# Patient Record
Sex: Male | Born: 1960
Health system: Southern US, Community
[De-identification: ages and names within clinical notes are randomized; demographics above are authoritative.]

## PROBLEM LIST (undated history)

## (undated) DIAGNOSIS — M1990 Unspecified osteoarthritis, unspecified site: Secondary | ICD-10-CM

## (undated) DIAGNOSIS — J189 Pneumonia, unspecified organism: Secondary | ICD-10-CM

## (undated) DIAGNOSIS — K219 Gastro-esophageal reflux disease without esophagitis: Secondary | ICD-10-CM

## (undated) HISTORY — PX: JOINT REPLACEMENT: SHX530

## (undated) HISTORY — PX: KNEE ARTHROSCOPY: SUR90

---
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
## 1999-04-09 HISTORY — PX: CERVICAL DISC SURGERY: SHX588

## 2003-11-25 ENCOUNTER — Encounter: Admission: RE | Admit: 2003-11-25 | Discharge: 2003-11-25 | Payer: Self-pay | Admitting: Orthopaedic Surgery

## 2006-06-02 ENCOUNTER — Ambulatory Visit (HOSPITAL_BASED_OUTPATIENT_CLINIC_OR_DEPARTMENT_OTHER): Admission: RE | Admit: 2006-06-02 | Discharge: 2006-06-02 | Payer: Self-pay | Admitting: Orthopedic Surgery

## 2010-04-08 HISTORY — PX: SHOULDER ARTHROSCOPY: SHX128

## 2015-01-30 ENCOUNTER — Other Ambulatory Visit: Payer: Self-pay | Admitting: Orthopedic Surgery

## 2015-02-03 ENCOUNTER — Other Ambulatory Visit: Payer: Self-pay | Admitting: Orthopedic Surgery

## 2015-02-03 DIAGNOSIS — M19012 Primary osteoarthritis, left shoulder: Secondary | ICD-10-CM

## 2015-02-10 ENCOUNTER — Ambulatory Visit
Admission: RE | Admit: 2015-02-10 | Discharge: 2015-02-10 | Disposition: A | Discharge status: Home / Self Care | Payer: Self-pay | Source: Ambulatory Visit | Attending: Orthopedic Surgery | Admitting: Orthopedic Surgery

## 2015-02-10 DIAGNOSIS — M19012 Primary osteoarthritis, left shoulder: Secondary | ICD-10-CM

## 2015-02-14 NOTE — Pre-Procedure Instructions (Addendum)
Jared Brooks  02/14/2015     No Pharmacies Listed   Your procedure is scheduled on Thursday, February 23, 2015  [facility] Admitting at 5:30 A.M.  Call this number if you have problems the morning of surgery:  [phone]   Remember:  Do not eat food or drink liquids after midnight Wednesday, February 22, 2015  Take these medicines the morning of surgery with A SIP OF WATER:  omeprazole (PRILOSEC OTC)  Stop taking Aspirin, vitamins, fish oil and herbal medications. Do not take any NSAIDs ie: Ibuprofen, Advil, Naproxen or any medication containing Aspirin; stop Thursday, February 16, 2015.   Do not wear jewelry, make-up or nail polish.  Do not wear lotions, powders, or perfumes.  You may not wear deodorant.  Do not shave 48 hours prior to surgery.  Men may shave face and neck.  Do not bring valuables to the hospital.  [facility] not [facility] any belongings or valuables.  Contacts, dentures or bridgework may not be worn into surgery.  Leave your suitcase in the car.  After surgery it may be brought to your room.  For patients admitted to the hospital, discharge time will be determined by your treatment team.  Patients discharged the day of surgery will not be allowed to drive home.   Name and phone number of your driver:    Special instructions:  Special Instructions:Special Instructions: [facility]  Before surgery, you can play an important role.  Because skin is not sterile, your skin needs to be as free of germs as possible.  You can reduce the number of germs on you skin by washing with CHG (chlorahexidine gluconate) soap before surgery.  CHG is an antiseptic cleaner which kills germs and bonds with the skin to continue killing germs even after washing.  Please DO NOT use if you have an allergy to CHG or antibacterial soaps.  If your skin becomes reddened/irritated stop using the CHG and inform your nurse when you  arrive at Short Stay.  Do not shave (including legs and underarms) for at least 48 hours prior to the first CHG shower.  You may shave your face.  Please follow these instructions carefully:   1.  Shower with CHG Soap the night before surgery and the morning of Surgery.  2.  If you choose to wash your hair, wash your hair first as usual with your normal shampoo.  3.  After you shampoo, rinse your hair and body thoroughly to remove the Shampoo.  4.  Use CHG as you would any other liquid soap.  You can apply chg directly  to the skin and wash gently with scrungie or a clean washcloth.  5.  Apply the CHG Soap to your body ONLY FROM THE NECK DOWN.  Do not use on open wounds or open sores.  Avoid contact with your eyes, ears, mouth and genitals (private parts).  Wash genitals (private parts) with your normal soap.  6.  Wash thoroughly, paying special attention to the area where your surgery will be performed.  7.  Thoroughly rinse your body with warm water from the neck down.  8.  DO NOT shower/wash with your normal soap after using and rinsing off the CHG Soap.  9.  Pat yourself dry with a clean towel.            10.  Wear clean pajamas.  11.  Place clean sheets on your bed the night of your first shower and do not sleep with pets.  Day of Surgery  Do not apply any lotions/deodorants the morning of surgery.  Please wear clean clothes to the hospital/surgery center.  Please read over the following fact sheets that you were given. Pain Booklet, Coughing and Deep Breathing, MRSA Information and Surgical Site Infection Prevention

## 2015-02-15 ENCOUNTER — Encounter (HOSPITAL_COMMUNITY)
Admission: RE | Admit: 2015-02-15 | Discharge: 2015-02-15 | Disposition: A | Discharge status: Home / Self Care | Payer: 59 | Source: Ambulatory Visit | Attending: Orthopedic Surgery | Admitting: Orthopedic Surgery

## 2015-02-15 ENCOUNTER — Encounter (HOSPITAL_COMMUNITY): Payer: Self-pay

## 2015-02-15 DIAGNOSIS — Z01818 Encounter for other preprocedural examination: Secondary | ICD-10-CM | POA: Diagnosis not present

## 2015-02-15 DIAGNOSIS — M19012 Primary osteoarthritis, left shoulder: Secondary | ICD-10-CM | POA: Diagnosis not present

## 2015-02-15 HISTORY — DX: Pneumonia, unspecified organism: J18.9

## 2015-02-15 HISTORY — DX: Unspecified osteoarthritis, unspecified site: M19.90

## 2015-02-15 HISTORY — DX: Gastro-esophageal reflux disease without esophagitis: K21.9

## 2015-02-15 LAB — URINALYSIS, ROUTINE W REFLEX MICROSCOPIC
BILIRUBIN URINE: NEGATIVE
Glucose, UA: NEGATIVE mg/dL
HGB URINE DIPSTICK: NEGATIVE
KETONES UR: NEGATIVE mg/dL
Leukocytes, UA: NEGATIVE
NITRITE: NEGATIVE
PROTEIN: NEGATIVE mg/dL
SPECIFIC GRAVITY, URINE: 1.02 (ref 1.005–1.030)
UROBILINOGEN UA: 0.2 mg/dL (ref 0.0–1.0)
pH: 5.5 (ref 5.0–8.0)

## 2015-02-15 LAB — APTT: aPTT: 28 seconds (ref 24–37)

## 2015-02-15 LAB — CBC WITH DIFFERENTIAL/PLATELET
BASOS PCT: 1 %
Basophils Absolute: 0 10*3/uL (ref 0.0–0.1)
EOS ABS: 0.1 10*3/uL (ref 0.0–0.7)
Eosinophils Relative: 3 %
HEMATOCRIT: 44.9 % (ref 39.0–52.0)
HEMOGLOBIN: 15.4 g/dL (ref 13.0–17.0)
LYMPHS ABS: 0.9 10*3/uL (ref 0.7–4.0)
Lymphocytes Relative: 23 %
MCH: 31.8 pg (ref 26.0–34.0)
MCHC: 34.3 g/dL (ref 30.0–36.0)
MCV: 92.6 fL (ref 78.0–100.0)
MONOS PCT: 11 %
Monocytes Absolute: 0.4 10*3/uL (ref 0.1–1.0)
NEUTROS ABS: 2.4 10*3/uL (ref 1.7–7.7)
NEUTROS PCT: 62 %
Platelets: 222 10*3/uL (ref 150–400)
RBC: 4.85 MIL/uL (ref 4.22–5.81)
RDW: 12.4 % (ref 11.5–15.5)
WBC: 3.8 10*3/uL — AB (ref 4.0–10.5)

## 2015-02-15 LAB — COMPREHENSIVE METABOLIC PANEL
ALT: 18 U/L (ref 17–63)
ANION GAP: 7 (ref 5–15)
AST: 20 U/L (ref 15–41)
Albumin: 3.9 g/dL (ref 3.5–5.0)
Alkaline Phosphatase: 77 U/L (ref 38–126)
BILIRUBIN TOTAL: 0.4 mg/dL (ref 0.3–1.2)
BUN: 13 mg/dL (ref 6–20)
CHLORIDE: 104 mmol/L (ref 101–111)
CO2: 29 mmol/L (ref 22–32)
Calcium: 9.6 mg/dL (ref 8.9–10.3)
Creatinine, Ser: 1.2 mg/dL (ref 0.61–1.24)
Glucose, Bld: 91 mg/dL (ref 65–99)
POTASSIUM: 4.5 mmol/L (ref 3.5–5.1)
SODIUM: 140 mmol/L (ref 135–145)
TOTAL PROTEIN: 6.7 g/dL (ref 6.5–8.1)

## 2015-02-15 LAB — PROTIME-INR
INR: 1.04 (ref 0.00–1.49)
PROTHROMBIN TIME: 13.8 s (ref 11.6–15.2)

## 2015-02-15 LAB — SURGICAL PCR SCREEN
MRSA, PCR: NEGATIVE
STAPHYLOCOCCUS AUREUS: NEGATIVE

## 2015-02-22 MED ORDER — CEFAZOLIN SODIUM-DEXTROSE 2-3 GM-% IV SOLR
2.0000 g | INTRAVENOUS | Status: AC
  Administered 2015-02-23: 2 g via INTRAVENOUS
  Filled 2015-02-22: qty 50

## 2015-02-22 MED ORDER — POVIDONE-IODINE 7.5 % EX SOLN
Freq: Once | CUTANEOUS | Status: DC
  Filled 2015-02-22: qty 118

## 2015-02-23 ENCOUNTER — Inpatient Hospital Stay (HOSPITAL_COMMUNITY)
Admission: RE | Admit: 2015-02-23 | Discharge: 2015-02-24 | DRG: 483 | Disposition: A | Discharge status: Home / Self Care | Payer: 59 | Source: Ambulatory Visit | Attending: Orthopedic Surgery | Admitting: Orthopedic Surgery

## 2015-02-23 ENCOUNTER — Inpatient Hospital Stay (HOSPITAL_COMMUNITY): Payer: 59

## 2015-02-23 ENCOUNTER — Inpatient Hospital Stay (HOSPITAL_COMMUNITY): Payer: 59 | Admitting: Anesthesiology

## 2015-02-23 ENCOUNTER — Encounter (HOSPITAL_COMMUNITY)
Admission: RE | Disposition: A | Discharge status: Home / Self Care | Payer: Self-pay | Source: Ambulatory Visit | Attending: Orthopedic Surgery

## 2015-02-23 ENCOUNTER — Encounter (HOSPITAL_COMMUNITY): Payer: Self-pay | Admitting: *Deleted

## 2015-02-23 DIAGNOSIS — M19019 Primary osteoarthritis, unspecified shoulder: Secondary | ICD-10-CM | POA: Diagnosis present

## 2015-02-23 DIAGNOSIS — F17220 Nicotine dependence, chewing tobacco, uncomplicated: Secondary | ICD-10-CM | POA: Diagnosis present

## 2015-02-23 DIAGNOSIS — M19012 Primary osteoarthritis, left shoulder: Principal | ICD-10-CM | POA: Diagnosis present

## 2015-02-23 DIAGNOSIS — K219 Gastro-esophageal reflux disease without esophagitis: Secondary | ICD-10-CM | POA: Diagnosis present

## 2015-02-23 DIAGNOSIS — Z96612 Presence of left artificial shoulder joint: Secondary | ICD-10-CM

## 2015-02-23 HISTORY — PX: TOTAL SHOULDER ARTHROPLASTY: SHX126

## 2015-02-23 SURGERY — ARTHROPLASTY, SHOULDER, TOTAL
Anesthesia: Regional | Site: Shoulder | Laterality: Left

## 2015-02-23 MED ORDER — DIPHENHYDRAMINE HCL 12.5 MG/5ML PO ELIX: 12.5000 mg | ORAL_SOLUTION | ORAL | Status: DC | PRN

## 2015-02-23 MED ORDER — EPHEDRINE SULFATE 50 MG/ML IJ SOLN
INTRAMUSCULAR | Status: AC
  Filled 2015-02-23: qty 1

## 2015-02-23 MED ORDER — METOCLOPRAMIDE HCL 5 MG/ML IJ SOLN: 5.0000 mg | Freq: Three times a day (TID) | INTRAMUSCULAR | Status: DC | PRN

## 2015-02-23 MED ORDER — BUPIVACAINE-EPINEPHRINE (PF) 0.5% -1:200000 IJ SOLN
INTRAMUSCULAR | Status: DC | PRN
  Administered 2015-02-23: 30 mL via PERINEURAL

## 2015-02-23 MED ORDER — ONDANSETRON HCL 4 MG PO TABS: 4.0000 mg | ORAL_TABLET | Freq: Four times a day (QID) | ORAL | Status: DC | PRN

## 2015-02-23 MED ORDER — OXYCODONE HCL 5 MG PO TABS
5.0000 mg | ORAL_TABLET | ORAL | Status: DC | PRN
  Administered 2015-02-23 – 2015-02-24 (×5): 10 mg via ORAL
  Filled 2015-02-23 (×5): qty 2

## 2015-02-23 MED ORDER — PROPOFOL 10 MG/ML IV BOLUS
INTRAVENOUS | Status: AC
  Filled 2015-02-23: qty 40

## 2015-02-23 MED ORDER — ACETAMINOPHEN 500 MG PO TABS
1000.0000 mg | ORAL_TABLET | Freq: Four times a day (QID) | ORAL | Status: AC
  Administered 2015-02-23 – 2015-02-24 (×4): 1000 mg via ORAL
  Filled 2015-02-23 (×4): qty 2

## 2015-02-23 MED ORDER — GLYCOPYRROLATE 0.2 MG/ML IJ SOLN
INTRAMUSCULAR | Status: AC
  Filled 2015-02-23: qty 1

## 2015-02-23 MED ORDER — CEFAZOLIN SODIUM-DEXTROSE 2-3 GM-% IV SOLR
2.0000 g | Freq: Four times a day (QID) | INTRAVENOUS | Status: AC
  Administered 2015-02-23 – 2015-02-24 (×2): 2 g via INTRAVENOUS
  Filled 2015-02-23 (×4): qty 50

## 2015-02-23 MED ORDER — SODIUM CHLORIDE 0.9 % IR SOLN
Status: DC | PRN
  Administered 2015-02-23: 1
  Administered 2015-02-23: 3000 mL

## 2015-02-23 MED ORDER — ROCURONIUM BROMIDE 50 MG/5ML IV SOLN
INTRAVENOUS | Status: AC
  Filled 2015-02-23: qty 1

## 2015-02-23 MED ORDER — MIDAZOLAM HCL 2 MG/2ML IJ SOLN
INTRAMUSCULAR | Status: AC
  Filled 2015-02-23: qty 2

## 2015-02-23 MED ORDER — SUGAMMADEX SODIUM 200 MG/2ML IV SOLN
INTRAVENOUS | Status: AC
  Filled 2015-02-23: qty 2

## 2015-02-23 MED ORDER — DOCUSATE SODIUM 100 MG PO CAPS
100.0000 mg | ORAL_CAPSULE | Freq: Two times a day (BID) | ORAL | Status: DC
  Administered 2015-02-23 – 2015-02-24 (×3): 100 mg via ORAL
  Filled 2015-02-23 (×3): qty 1

## 2015-02-23 MED ORDER — FENTANYL CITRATE (PF) 100 MCG/2ML IJ SOLN
INTRAMUSCULAR | Status: DC | PRN
  Administered 2015-02-23 (×3): 50 ug via INTRAVENOUS

## 2015-02-23 MED ORDER — ALUM & MAG HYDROXIDE-SIMETH 200-200-20 MG/5ML PO SUSP: 30.0000 mL | ORAL | Status: DC | PRN

## 2015-02-23 MED ORDER — POLYETHYLENE GLYCOL 3350 17 G PO PACK: 17.0000 g | PACK | Freq: Every day | ORAL | Status: DC | PRN

## 2015-02-23 MED ORDER — SODIUM CHLORIDE 0.9 % IJ SOLN
INTRAMUSCULAR | Status: AC
  Filled 2015-02-23: qty 10

## 2015-02-23 MED ORDER — OMEPRAZOLE MAGNESIUM 20 MG PO TBEC: 20.0000 mg | DELAYED_RELEASE_TABLET | Freq: Every day | ORAL | Status: DC

## 2015-02-23 MED ORDER — ONDANSETRON HCL 4 MG/2ML IJ SOLN
INTRAMUSCULAR | Status: DC | PRN
Start: 2015-02-23 — End: 2015-02-23
  Administered 2015-02-23: 4 mg via INTRAVENOUS

## 2015-02-23 MED ORDER — HEMOSTATIC AGENTS (NO CHARGE) OPTIME
TOPICAL | Status: DC | PRN
  Administered 2015-02-23: 1 via TOPICAL

## 2015-02-23 MED ORDER — DOCUSATE SODIUM 100 MG PO CAPS: 100.0000 mg | ORAL_CAPSULE | Freq: Three times a day (TID) | ORAL | Status: AC | PRN

## 2015-02-23 MED ORDER — ZOLPIDEM TARTRATE 5 MG PO TABS: 5.0000 mg | ORAL_TABLET | Freq: Every evening | ORAL | Status: DC | PRN

## 2015-02-23 MED ORDER — ONDANSETRON HCL 4 MG/2ML IJ SOLN: 4.0000 mg | Freq: Four times a day (QID) | INTRAMUSCULAR | Status: DC | PRN

## 2015-02-23 MED ORDER — TRANEXAMIC ACID 1000 MG/10ML IV SOLN
1000.0000 mg | INTRAVENOUS | Status: AC
  Administered 2015-02-23: 1000 mg via INTRAVENOUS
  Filled 2015-02-23: qty 10

## 2015-02-23 MED ORDER — LIDOCAINE HCL (CARDIAC) 20 MG/ML IV SOLN
INTRAVENOUS | Status: AC
  Filled 2015-02-23: qty 5

## 2015-02-23 MED ORDER — ASPIRIN EC 325 MG PO TBEC
325.0000 mg | DELAYED_RELEASE_TABLET | Freq: Two times a day (BID) | ORAL | Status: DC
  Administered 2015-02-23 – 2015-02-24 (×2): 325 mg via ORAL
  Filled 2015-02-23 (×2): qty 1

## 2015-02-23 MED ORDER — BISACODYL 5 MG PO TBEC: 5.0000 mg | DELAYED_RELEASE_TABLET | Freq: Every day | ORAL | Status: DC | PRN

## 2015-02-23 MED ORDER — LIDOCAINE HCL (CARDIAC) 20 MG/ML IV SOLN
INTRAVENOUS | Status: DC | PRN
  Administered 2015-02-23: 50 mg via INTRAVENOUS

## 2015-02-23 MED ORDER — PHENYLEPHRINE 40 MCG/ML (10ML) SYRINGE FOR IV PUSH (FOR BLOOD PRESSURE SUPPORT)
PREFILLED_SYRINGE | INTRAVENOUS | Status: AC
  Filled 2015-02-23: qty 10

## 2015-02-23 MED ORDER — PROPOFOL 10 MG/ML IV BOLUS
INTRAVENOUS | Status: DC | PRN
Start: 2015-02-23 — End: 2015-02-23
  Administered 2015-02-23: 200 mg via INTRAVENOUS

## 2015-02-23 MED ORDER — ROCURONIUM BROMIDE 50 MG/5ML IV SOLN
INTRAVENOUS | Status: AC
  Filled 2015-02-23: qty 2

## 2015-02-23 MED ORDER — FENTANYL CITRATE (PF) 250 MCG/5ML IJ SOLN
INTRAMUSCULAR | Status: AC
  Filled 2015-02-23: qty 5

## 2015-02-23 MED ORDER — METOCLOPRAMIDE HCL 5 MG PO TABS: 5.0000 mg | ORAL_TABLET | Freq: Three times a day (TID) | ORAL | Status: DC | PRN

## 2015-02-23 MED ORDER — SUCCINYLCHOLINE CHLORIDE 20 MG/ML IJ SOLN
INTRAMUSCULAR | Status: AC
  Filled 2015-02-23: qty 1

## 2015-02-23 MED ORDER — LACTATED RINGERS IV SOLN
INTRAVENOUS | Status: DC | PRN
  Administered 2015-02-23 (×2): via INTRAVENOUS

## 2015-02-23 MED ORDER — MIDAZOLAM HCL 5 MG/5ML IJ SOLN
INTRAMUSCULAR | Status: DC | PRN
  Administered 2015-02-23 (×2): 1 mg via INTRAVENOUS

## 2015-02-23 MED ORDER — FLEET ENEMA 7-19 GM/118ML RE ENEM: 1.0000 | ENEMA | Freq: Once | RECTAL | Status: DC | PRN

## 2015-02-23 MED ORDER — MENTHOL 3 MG MT LOZG
1.0000 | LOZENGE | OROMUCOSAL | Status: DC | PRN
Start: 2015-02-23 — End: 2015-02-24

## 2015-02-23 MED ORDER — SUGAMMADEX SODIUM 200 MG/2ML IV SOLN
INTRAVENOUS | Status: DC | PRN
  Administered 2015-02-23: 175.4 mg via INTRAVENOUS

## 2015-02-23 MED ORDER — HYDROMORPHONE HCL 1 MG/ML IJ SOLN: 0.2500 mg | INTRAMUSCULAR | Status: DC | PRN

## 2015-02-23 MED ORDER — SODIUM CHLORIDE 0.9 % IV SOLN
INTRAVENOUS | Status: DC
  Administered 2015-02-23 – 2015-02-24 (×2): via INTRAVENOUS

## 2015-02-23 MED ORDER — MORPHINE SULFATE (PF) 2 MG/ML IV SOLN: 2.0000 mg | INTRAVENOUS | Status: DC | PRN

## 2015-02-23 MED ORDER — ACETAMINOPHEN 325 MG PO TABS: 650.0000 mg | ORAL_TABLET | Freq: Four times a day (QID) | ORAL | Status: DC | PRN

## 2015-02-23 MED ORDER — DEXAMETHASONE SODIUM PHOSPHATE 4 MG/ML IJ SOLN
INTRAMUSCULAR | Status: DC | PRN
  Administered 2015-02-23: 8 mg via INTRAVENOUS

## 2015-02-23 MED ORDER — DEXAMETHASONE SODIUM PHOSPHATE 4 MG/ML IJ SOLN
INTRAMUSCULAR | Status: AC
  Filled 2015-02-23: qty 2

## 2015-02-23 MED ORDER — PHENOL 1.4 % MT LIQD: 1.0000 | OROMUCOSAL | Status: DC | PRN

## 2015-02-23 MED ORDER — ONDANSETRON HCL 4 MG/2ML IJ SOLN
INTRAMUSCULAR | Status: AC
  Filled 2015-02-23: qty 2

## 2015-02-23 MED ORDER — PHENYLEPHRINE HCL 10 MG/ML IJ SOLN
10.0000 mg | INTRAMUSCULAR | Status: DC | PRN
  Administered 2015-02-23: 20 ug/min via INTRAVENOUS

## 2015-02-23 MED ORDER — PANTOPRAZOLE SODIUM 40 MG PO TBEC
40.0000 mg | DELAYED_RELEASE_TABLET | Freq: Every day | ORAL | Status: DC
  Administered 2015-02-23 – 2015-02-24 (×2): 40 mg via ORAL
  Filled 2015-02-23 (×2): qty 1

## 2015-02-23 MED ORDER — OXYCODONE-ACETAMINOPHEN 5-325 MG PO TABS: 1.0000 | ORAL_TABLET | ORAL | Status: AC | PRN

## 2015-02-23 MED ORDER — ACETAMINOPHEN 650 MG RE SUPP: 650.0000 mg | Freq: Four times a day (QID) | RECTAL | Status: DC | PRN

## 2015-02-23 MED ORDER — LIDOCAINE HCL 4 % MT SOLN
OROMUCOSAL | Status: DC | PRN
  Administered 2015-02-23: 3 mL via TOPICAL

## 2015-02-23 MED ORDER — ROCURONIUM BROMIDE 100 MG/10ML IV SOLN
INTRAVENOUS | Status: DC | PRN
  Administered 2015-02-23: 20 mg via INTRAVENOUS
  Administered 2015-02-23: 30 mg via INTRAVENOUS
  Administered 2015-02-23 (×2): 20 mg via INTRAVENOUS

## 2015-02-23 SURGICAL SUPPLY — 69 items
BIT DRILL 5/64X5 DISP (BIT) ×1 IMPLANT
BLADE SAW SAG 73X25 THK (BLADE) ×1
BLADE SAW SGTL 73X25 THK (BLADE) ×1 IMPLANT
BLADE SURG 15 STRL LF DISP TIS (BLADE) ×1 IMPLANT
BLADE SURG 15 STRL SS (BLADE) ×2
BROCHE GUIDE ×2 IMPLANT
CAP SHOULDER TOTAL 2 ×1 IMPLANT
CEMENT BONE DEPUY (Cement) ×1 IMPLANT
CHLORAPREP W/TINT 26ML (MISCELLANEOUS) ×2 IMPLANT
COVER MAYO STAND STRL (DRAPES) ×2 IMPLANT
COVER SURGICAL LIGHT HANDLE (MISCELLANEOUS) ×2 IMPLANT
DRAPE IMP U-DRAPE 54X76 (DRAPES) ×2 IMPLANT
DRAPE INCISE IOBAN 66X45 STRL (DRAPES) ×3 IMPLANT
DRAPE ORTHO SPLIT 77X108 STRL (DRAPES) ×4
DRAPE SURG 17X23 STRL (DRAPES) ×2 IMPLANT
DRAPE SURG ORHT 6 SPLT 77X108 (DRAPES) ×2 IMPLANT
DRAPE U-SHAPE 47X51 STRL (DRAPES) ×2 IMPLANT
DRSG AQUACEL AG ADV 3.5X10 (GAUZE/BANDAGES/DRESSINGS) ×1 IMPLANT
ELECT BLADE 4.0 EZ CLEAN MEGAD (MISCELLANEOUS) ×2
ELECT REM PT RETURN 9FT ADLT (ELECTROSURGICAL) ×2
ELECTRODE BLDE 4.0 EZ CLN MEGD (MISCELLANEOUS) IMPLANT
ELECTRODE REM PT RTRN 9FT ADLT (ELECTROSURGICAL) ×1 IMPLANT
EVACUATOR 1/8 PVC DRAIN (DRAIN) IMPLANT
GLOVE BIO SURGEON STRL SZ7 (GLOVE) ×2 IMPLANT
GLOVE BIO SURGEON STRL SZ7.5 (GLOVE) ×2 IMPLANT
GLOVE BIOGEL PI IND STRL 7.0 (GLOVE) ×1 IMPLANT
GLOVE BIOGEL PI IND STRL 8 (GLOVE) ×1 IMPLANT
GLOVE BIOGEL PI INDICATOR 7.0 (GLOVE) ×1
GLOVE BIOGEL PI INDICATOR 8 (GLOVE) ×1
GOWN STRL REUS W/ TWL LRG LVL3 (GOWN DISPOSABLE) ×1 IMPLANT
GOWN STRL REUS W/ TWL XL LVL3 (GOWN DISPOSABLE) ×1 IMPLANT
GOWN STRL REUS W/TWL LRG LVL3 (GOWN DISPOSABLE) ×2
GOWN STRL REUS W/TWL XL LVL3 (GOWN DISPOSABLE) ×2
HANDPIECE INTERPULSE COAX TIP (DISPOSABLE) ×2
HEMOSTAT SURGICEL 2X14 (HEMOSTASIS) ×2 IMPLANT
HOOD PEEL AWAY FACE SHEILD DIS (HOOD) ×4 IMPLANT
KIT BASIN OR (CUSTOM PROCEDURE TRAY) ×2 IMPLANT
KIT ROOM TURNOVER OR (KITS) ×2 IMPLANT
MANIFOLD NEPTUNE II (INSTRUMENTS) ×2 IMPLANT
NDL HYPO 25GX1X1/2 BEV (NEEDLE) IMPLANT
NDL MAYO TROCAR (NEEDLE) ×1 IMPLANT
NEEDLE HYPO 25GX1X1/2 BEV (NEEDLE) ×2 IMPLANT
NEEDLE MAYO TROCAR (NEEDLE) ×6 IMPLANT
NS IRRIG 1000ML POUR BTL (IV SOLUTION) ×2 IMPLANT
PACK SHOULDER (CUSTOM PROCEDURE TRAY) ×2 IMPLANT
PAD ARMBOARD 7.5X6 YLW CONV (MISCELLANEOUS) ×4 IMPLANT
RETRIEVER SUT HEWSON (MISCELLANEOUS) ×2 IMPLANT
SET HNDPC FAN SPRY TIP SCT (DISPOSABLE) ×1 IMPLANT
SLING ARM IMMOBILIZER LRG (SOFTGOODS) ×2 IMPLANT
SLING ARM IMMOBILIZER MED (SOFTGOODS) IMPLANT
SMARTMIX MINI TOWER (MISCELLANEOUS) ×2
SPONGE LAP 18X18 X RAY DECT (DISPOSABLE) ×2 IMPLANT
SPONGE LAP 4X18 X RAY DECT (DISPOSABLE) IMPLANT
STRIP CLOSURE SKIN 1/2X4 (GAUZE/BANDAGES/DRESSINGS) ×2 IMPLANT
SUCTION FRAZIER TIP 10 FR DISP (SUCTIONS) ×2 IMPLANT
SUPPORT WRAP ARM LG (MISCELLANEOUS) ×2 IMPLANT
SUT ETHIBOND NAB CT1 #1 30IN (SUTURE) ×5 IMPLANT
SUT MNCRL AB 4-0 PS2 18 (SUTURE) ×2 IMPLANT
SUT SILK 2 0 TIES 17X18 (SUTURE)
SUT SILK 2-0 18XBRD TIE BLK (SUTURE) IMPLANT
SUT VIC AB 0 CTB1 27 (SUTURE) ×1 IMPLANT
SUT VIC AB 2-0 CT1 27 (SUTURE) ×2
SUT VIC AB 2-0 CT1 TAPERPNT 27 (SUTURE) ×1 IMPLANT
SYR CONTROL 10ML LL (SYRINGE) IMPLANT
TAPE FIBER 2MM 7IN #2 BLUE (SUTURE) ×3 IMPLANT
TAPE SUT LABRALTAP WHT/BLK (SUTURE) ×2 IMPLANT
TOWEL OR 17X24 6PK STRL BLUE (TOWEL DISPOSABLE) ×2 IMPLANT
TOWEL OR 17X26 10 PK STRL BLUE (TOWEL DISPOSABLE) ×2 IMPLANT
TOWER SMARTMIX MINI (MISCELLANEOUS) ×1 IMPLANT

## 2015-02-23 NOTE — Discharge Instructions (Signed)

## 2015-02-23 NOTE — Anesthesia Postprocedure Evaluation (Signed)
  Anesthesia Post-op Note  Patient: Jared Brooks  Procedure(s) Performed: Procedure(s) with comments: TOTAL SHOULDER ARTHROPLASTY (Left) - Left shoulder total arthroplasty  Patient Location: PACU  Anesthesia Type:General and block  Level of Consciousness: awake and alert   Airway and Oxygen Therapy: Patient Spontanous Breathing  Post-op Pain: Controlled  Post-op Assessment: Post-op Vital signs reviewed, Patient's Cardiovascular Status Stable and Respiratory Function Stable  Post-op Vital Signs: Reviewed  Filed Vitals:   02/23/15 1130  BP: 95/58  Pulse: 69  Temp: 36.9 C  Resp: 12    Complications: No apparent anesthesia complications

## 2015-02-23 NOTE — Care Management (Signed)
Utilization review completed. Loyal Holzheimer, RN Case Manager [phone]. 

## 2015-02-23 NOTE — Progress Notes (Signed)
Report given to robin rn as caregiver 

## 2015-02-23 NOTE — Transfer of Care (Signed)
Immediate Anesthesia Transfer of Care Note  Patient: Jared Brooks  Procedure(s) Performed: Procedure(s) with comments: TOTAL SHOULDER ARTHROPLASTY (Left) - Left shoulder total arthroplasty  Patient Location: PACU  Anesthesia Type:General  Level of Consciousness: awake, alert  and oriented  Airway & Oxygen Therapy: Patient Spontanous Breathing and Patient connected to nasal cannula oxygen  Post-op Assessment: Report given to RN and Post -op Vital signs reviewed and stable  Post vital signs: Reviewed and stable  Last Vitals:  Filed Vitals:   02/23/15 0613  BP: 116/71  Pulse: 63  Temp: 36.8 C  Resp: 20    Complications: No apparent anesthesia complications
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
## 2015-02-23 NOTE — Anesthesia Procedure Notes (Addendum)
Anesthesia Regional Block:  Interscalene brachial plexus block  Pre-Anesthetic Checklist: ,, timeout performed, Correct Patient, Correct Site, Correct Laterality, Correct Procedure, Correct Position, site marked, Risks and benefits discussed, pre-op evaluation,  At surgeon's request and post-op pain management  Laterality: Left  Prep: Maximum Sterile Barrier Precautions used and chloraprep       Needles:  Injection technique: Single-shot  Needle Type: Echogenic Stimulator Needle     Needle Length: 5cm 5 cm Needle Gauge: 22 and 22 G    Additional Needles:  Procedures: ultrasound guided (picture in chart) and nerve stimulator Interscalene brachial plexus block  Nerve Stimulator or Paresthesia:  Response: Biceps response,   Additional Responses:   Narrative:  Start time: 02/23/2015 7:00 AM End time: 02/23/2015 7:10 AM Injection made incrementally with aspirations every 5 mL. Anesthesiologist: Roderic Palau  Additional Notes: 2% Lidocaine skin wheel.    Procedure Name: Intubation Date/Time: 02/23/2015 7:38 AM Performed by: Susa Loffler Pre-anesthesia Checklist: Patient identified, Timeout performed, Emergency Drugs available, Suction available and Patient being monitored Patient Re-evaluated:Patient Re-evaluated prior to inductionOxygen Delivery Method: Circle system utilized Preoxygenation: Pre-oxygenation with 100% oxygen Intubation Type: IV induction Ventilation: Mask ventilation without difficulty and Oral airway inserted - appropriate to patient size Laryngoscope Size: Mac and 4 Grade View: Grade I Tube type: Oral Tube size: 7.5 mm Number of attempts: 1 Airway Equipment and Method: Stylet,  Oral airway and LTA kit utilized Placement Confirmation: ETT inserted through vocal cords under direct vision,  positive ETCO2 and breath sounds checked- equal and bilateral Secured at: 23 cm Tube secured with: Tape Dental Injury: Teeth and Oropharynx as per  pre-operative assessment

## 2015-02-23 NOTE — Anesthesia Preprocedure Evaluation (Addendum)
Anesthesia Evaluation  Patient identified by MRN, date of birth, ID band Patient awake    Reviewed: Allergy & Precautions, H&P , NPO status , Patient's Chart, lab work & pertinent test results  Airway Mallampati: II  TM Distance: >3 FB Neck ROM: Full    Dental no notable dental hx. (+) Teeth Intact, Dental Advisory Given   Pulmonary neg pulmonary ROS,    Pulmonary exam normal breath sounds clear to auscultation       Cardiovascular negative cardio ROS   Rhythm:Regular Rate:Normal     Neuro/Psych negative neurological ROS  negative psych ROS   GI/Hepatic Neg liver ROS, GERD  Medicated and Controlled,  Endo/Other  negative endocrine ROS  Renal/GU negative Renal ROS  negative genitourinary   Musculoskeletal  (+) Arthritis , Osteoarthritis,    Abdominal   Peds  Hematology negative hematology ROS (+)   Anesthesia Other Findings   Reproductive/Obstetrics negative OB ROS                            Anesthesia Physical Anesthesia Plan  ASA: II  Anesthesia Plan: General and Regional   Post-op Pain Management: GA combined w/ Regional for post-op pain   Induction: Intravenous  Airway Management Planned: Oral ETT  Additional Equipment:   Intra-op Plan:   Post-operative Plan: Extubation in OR  Informed Consent: I have reviewed the patients History and Physical, chart, labs and discussed the procedure including the risks, benefits and alternatives for the proposed anesthesia with the patient or authorized representative who has indicated his/her understanding and acceptance.   Dental advisory given  Plan Discussed with: CRNA  Anesthesia Plan Comments:         Anesthesia Quick Evaluation

## 2015-02-23 NOTE — Op Note (Signed)
Procedure(s): TOTAL SHOULDER ARTHROPLASTY Procedure Note  Jared Brooks male 54 y.o. 02/23/2015  Procedure(s) and Anesthesia Type:    * LEFT TOTAL SHOULDER ARTHROPLASTY - Choice  Surgeon(s) and Role:    * Jones Broom, MD - Primary   Indications:  54 y.o. male  With endstage left shoulder arthritis. Pain and dysfunction interfered with quality of life and nonoperative treatment with activity modification, NSAIDS and injections failed.     Surgeon: Mable Paris   Assistants: Damita Lack PA-C [facility] was present and scrubbed throughout the procedure and was essential in positioning, retraction, exposure, and closure)  Anesthesia: General endotracheal anesthesia with preoperative interscalene block given by the attending anesthesiologist    Procedure Detail  TOTAL SHOULDER ARTHROPLASTY  Findings: Tornier flex anatomic press-fit size 6 stem with a 19 x 15 head, cemented size medium Cortiloc glenoid with 10-15 preferential anterior reaming to correct posterior version.   A lesser tuberosity osteotomy was performed and repaired at the conclusion of the procedure.  Estimated Blood Loss:  200 mL         Drains: None   Blood Given: none          Specimens: none        Complications:  * No complications entered in OR log *         Disposition: PACU - hemodynamically stable.         Condition: stable    Procedure:   The patient was identified in the preoperative holding area where I personally marked the operative extremity after verifying with the patient and consent. He  was taken to the operating room where He was transferred to the   operative table.  The patient received an interscalene block in   the holding area by the attending anesthesiologist.  General anesthesia was induced   in the operating room without complication.  The patient did receive IV  Ancef prior to the commencement of the procedure.  The patient was   placed in the  beach-chair position with the back raised about 30   degrees.  The nonoperative extremity and head and neck were carefully   positioned and padded protecting against neurovascular compromise.  The   left upper extremity was then prepped and draped in the standard sterile   fashion.    The appropriate operative time-out was performed with   Anesthesia, the perioperative staff, as well as myself and we all agreed   that the left side was the correct operative site.  An approximately   10 cm incision was made from the tip of the coracoid to the center point of the   humerus at the level of the axilla.  Dissection was carried down sharply   through subcutaneous tissues and cephalic vein was identified and taken   laterally with the deltoid.  The pectoralis major was taken medially.  The   upper 1 cm of the pectoralis major was released from its attachment on   the humerus.  The clavipectoral fascia was incised just lateral to the   conjoined tendon.  This incision was carried up to but not into the   coracoacromial ligament.  Digital palpation was used to prove   integrity of the axillary nerve which was protected throughout the   procedure.  Musculocutaneous nerve was not palpated in the operative   field.  Conjoined tendon was then retracted gently medially and the   deltoid laterally.  Anterior circumflex humeral vessels were clamped and  coagulated.  The soft tissues overlying the biceps was incised and this   incision was carried across the transverse humeral ligament to the base   of the coracoid.  The biceps was tenodesed to the soft tissue just above   pectoralis major and the remaining portion of the biceps superiorly was   excised.  An osteotomy was performed at the lesser tuberosity  and the   subscapularis was freed from the underlying capsule.  Capsule was then   released all the way down to the 6 o'clock position of the humeral head.   The humeral head was then delivered with  simultaneous adduction,   extension and external rotation.  All humeral osteophytes were removed   and the anatomic neck of the humerus was marked and cut free hand at   approximately 25 degrees retroversion within about 3 mm of the cuff   reflection posteriorly.  The head size was estimated to be a 50 medium   offset.  At that point, the humeral head was retracted posteriorly with   a Fukuda retractor and the anterior-inferior capsule was excised.   Remaining portion of the capsule was released at the base of the   coracoid.  The remaining biceps anchor and the entire anterior-inferior   labrum was excised.  The posterior labrum was also excised but the   posterior capsule was not released.  Exposure was fairly challenging given his preoperative stiffness and large musculature. The guidepin was placed bicortically with 10 elevated guide, but the posterior aspect was still slightly elevated, probably giving about 15 of correction..  The reamer was used to ream to concentric bone with punctate bleeding.  This gave an excellent concentric surface.  The center hole was then drilled for an anchor peg glenoid followed by the three peripheral holes and none of the holes   exited the glenoid wall.  I then pulse irrigated these holes and dried   them with Surgicel.  The three peripheral holes were then   pressurized cemented and the anchor peg glenoid was placed and impacted   with an excellent fit.  The glenoid was a medium 40 component.  The proximal humerus was then again exposed taking care not to displace the glenoid.    The entry awl was used followed by sounding reamers and then sequentially broached from size 3 to 6. This was then left in place and the calcar planer was used. Trial head was placed with a 50 x 19.  With the trial implantation of the component,  there was approximately 50% posterior translation with immediate snap back to the   anatomic position.  With forward elevation, there was no  tendency   towards posterior subluxation.   The trial was removed and the final implant was prepared on a back table.  The trial was removed and the final implant was prepared on a back table.   3 small holes were drilled on the medial side of the lesser tuberosity osteotomy, through which 2 labral tapes were passed. The implant was then placed through the loop of the 2 labral tapes and impacted with an excellent press-fit. This achieved excellent anatomic reconstruction of the proximal humerus.  The joint was then copiously irrigated with pulse lavage.  The subscapularis and   lesser tuberosity osteotomy were then repaired using the 2 labral tapes previously passed in a double row fashion with horizontal mattress sutures medially brought over through bone tunnels tied over a bone bridge laterally.  One #1 Ethibond was placed at the rotator interval just above   the lesser tuberosity. Copious irrigation was used. Skin was closed with 2-0 Vicryl sutures in the deep dermal layer and 4-0 Monocryl in a subcuticular  running fashion.  Sterile dressings were then applied including Aquacel.  The patient was placed in a sling and allowed to awaken from general anesthesia and taken to the recovery room in stable condition.      POSTOPERATIVE PLAN:  Early passive range of motion will be allowed with the goal of 30 degrees external rotation and 130 degrees forward elevation.  No internal rotation at this time.  No active motion of the arm until the lesser tuberosity heals.  The patient will likely be kept in the hospital for 1-2 days and then discharged home.

## 2015-02-24 ENCOUNTER — Encounter (HOSPITAL_COMMUNITY): Payer: Self-pay | Admitting: Orthopedic Surgery

## 2015-02-24 LAB — CBC
HCT: 35.8 % — ABNORMAL LOW (ref 39.0–52.0)
Hemoglobin: 12.1 g/dL — ABNORMAL LOW (ref 13.0–17.0)
MCH: 31.5 pg (ref 26.0–34.0)
MCHC: 33.8 g/dL (ref 30.0–36.0)
MCV: 93.2 fL (ref 78.0–100.0)
PLATELETS: 203 10*3/uL (ref 150–400)
RBC: 3.84 MIL/uL — AB (ref 4.22–5.81)
RDW: 12.5 % (ref 11.5–15.5)
WBC: 6.9 10*3/uL (ref 4.0–10.5)

## 2015-02-24 LAB — BASIC METABOLIC PANEL
ANION GAP: 8 (ref 5–15)
BUN: 10 mg/dL (ref 6–20)
CO2: 25 mmol/L (ref 22–32)
Calcium: 8.6 mg/dL — ABNORMAL LOW (ref 8.9–10.3)
Chloride: 104 mmol/L (ref 101–111)
Creatinine, Ser: 1.07 mg/dL (ref 0.61–1.24)
GLUCOSE: 106 mg/dL — AB (ref 65–99)
POTASSIUM: 4 mmol/L (ref 3.5–5.1)
Sodium: 137 mmol/L (ref 135–145)

## 2015-02-24 NOTE — Progress Notes (Signed)
   PATIENT ID: Jared Brooks   1 Day Post-Op Procedure(s) (LRB): TOTAL SHOULDER ARTHROPLASTY (Left)  Subjective: Pain overnight but tolerable. Block has worn off. Comfortable in sling and up and walking. No other complaints.  Objective:  Filed Vitals:   02/24/15 0322  BP: 109/55  Pulse: 65  Temp: 98.2 F (36.8 C)  Resp: 16     L UE dressing with small spotted dried blood, dressing intact Some ecchymosis anterior chest/axilla Wiggles fingers, distally NVI Activates axillary n.   Labs:   Recent Labs  02/24/15 0316  HGB 12.1*   Recent Labs  02/24/15 0316  WBC 6.9  RBC 3.84*  HCT 35.8*  PLT 203   Recent Labs  02/24/15 0316  NA 137  K 4.0  CL 104  CO2 25  BUN 10  CREATININE 1.07  GLUCOSE 106*  CALCIUM 8.6*    Assessment and Plan: Doing well 1 day s/p left TSA Percocet for pain control, script in chart for d/c D/c home today after PT, passive ROM limited to 130 ff and 30 er Fu with Dr. Ave Filterhandler in 2 weeks  VTE proph: ASA 325mg  BID, SCDs

## 2015-02-24 NOTE — Discharge Summary (Signed)
Patient ID: STETSON PELAEZ MRN: [id number] DOB/AGE: Aug 18, 1960 54 y.o.  Admit date: 02/23/2015 Discharge date: 02/24/2015  Admission Diagnoses:  Active Problems:   Glenohumeral arthritis   Discharge Diagnoses:  Same  Past Medical History  Diagnosis Date  . Pneumonia     hx  . GERD (gastroesophageal reflux disease)   . Arthritis     Surgeries: Procedure(s): TOTAL SHOULDER ARTHROPLASTY on 02/23/2015   Consultants:    Discharged Condition: Improved  Hospital Course: DOMENICK QUEBEDEAUX is an 54 y.o. male who was admitted 02/23/2015 for operative treatment of end stage right glenohumeral OA. Patient has severe unremitting pain that affects sleep, daily activities, and work/hobbies. After pre-op clearance the patient was taken to the operating room on 02/23/2015 and underwent  Procedure(s): TOTAL SHOULDER ARTHROPLASTY.    Patient was given perioperative antibiotics: Anti-infectives    Start     Dose/Rate Route Frequency Ordered Stop   02/23/15 1300  ceFAZolin (ANCEF) IVPB 2 g/50 mL premix     2 g 100 mL/hr over 30 Minutes Intravenous Every 6 hours 02/23/15 1146 02/24/15 0659   02/23/15 0700  ceFAZolin (ANCEF) IVPB 2 g/50 mL premix     2 g 100 mL/hr over 30 Minutes Intravenous To ShortStay Surgical 02/22/15 1317 02/23/15 0810       Patient was given sequential compression devices, early ambulation, and ASA  BID to prevent DVT.  Patient benefited maximally from hospital stay and there were no complications.    Recent vital signs: Patient Vitals for the past 24 hrs:  BP Temp Temp src Pulse Resp SpO2  02/24/15 0322 (!) 109/55 mmHg 98.2 F (36.8 C) Oral 65 16 95 %  02/23/15 2335 (!) 108/53 mmHg 98.4 F (36.9 C) Oral 75 15 98 %  02/23/15 2218 - 98.2 F (36.8 C) Oral - - -  02/23/15 2117 - 97.8 F (36.6 C) Oral - - -  02/23/15 1952 108/70 mmHg 100.3 F (37.9 C) Oral 77 16 98 %  02/23/15 1154 103/66 mmHg 98 F (36.7 C) - 67 14 96 %  02/23/15 1130 (!) 95/58 mmHg  98.4 F (36.9 C) - 69 12 98 %  02/23/15 1115 105/73 mmHg - - 87 14 100 %  02/23/15 1110 105/73 mmHg 98.2 F (36.8 C) - 92 14 98 %     Recent laboratory studies:  Recent Labs  02/24/15 0316  WBC 6.9  HGB 12.1*  HCT 35.8*  PLT 203  NA 137  K 4.0  CL 104  CO2 25  BUN 10  CREATININE 1.07  GLUCOSE 106*  CALCIUM 8.6*     Discharge Medications:     Medication List    STOP taking these medications        ibuprofen 200 MG tablet  Commonly known as:  ADVIL,MOTRIN      TAKE these medications        docusate sodium 100 MG capsule  Commonly known as:  COLACE  Take 1 capsule (100 mg total) by mouth 3 (three) times daily as needed.     omeprazole 20 MG tablet  Commonly known as:  PRILOSEC OTC  Take 20 mg by mouth daily.     oxyCODONE-acetaminophen 5-325 MG tablet  Commonly known as:  ROXICET  Take 1-2 tablets by mouth every 4 (four) hours as needed for severe pain.        Diagnostic Studies: Dg Chest 2 View  02/15/2015  CLINICAL DATA:  Arthroplasty. EXAM: CHEST  2 VIEW COMPARISON:  No prior exams available for comparison. FINDINGS: Mediastinum hilar structures normal. Faint solitary tiny nodular opacities noted over both lung bases seen only on PA view consistent with nipple shadows. Scratched Lungs are clear of acute infiltrates. No pleural effusion or pneumothorax. Heart size normal. Degenerative changes thoracic spine. IMPRESSION: No acute cardiopulmonary disease. Electronically Signed   By: Maisie Fushomas  Register   On: 02/15/2015 10:10   Ct Shoulder Left Wo Contrast  02/10/2015  CLINICAL DATA:  Preop for left shoulder surgery. EXAM: CT OF THE LEFT SHOULDER WITHOUT CONTRAST TECHNIQUE: Multidetector CT imaging was performed according to the standard protocol. Multiplanar CT image reconstructions were also generated. COMPARISON:  MR left shoulder 08/27/2010 FINDINGS: There is no acute fracture or dislocation. There is severe osteoarthritis of the left glenohumeral joint with  severe joint space narrowing, subchondral cystic changes and marginal osteophytosis. There is no significant bone stock loss. There is no aggressive lytic or sclerotic osseous lesion. There is evidence of prior acromioplasty. Type I acromion. There is a large loose body measuring 2.3 cm in the subcoracoid recess. There are smaller loose bodies in the axillary recess. The muscles are normal. There is no muscle atrophy. There is no fluid collection or hematoma. Partially visualized is degenerative disc disease of the lower cervical and thoracic spine. There is no axillary lymphadenopathy. The visualized left lung is clear. There is mild left retroareolar fibroglandular tissue as can be seen with gynecomastia. IMPRESSION: Severe osteoarthritis of the left glenohumeral joint. Large loose body in the subcoracoid recess and smaller loose bodies in the axillary recess. Electronically Signed   By: Elige KoHetal  Patel   On: 02/10/2015 13:28   Dg Shoulder Left Port  02/23/2015  CLINICAL DATA:  Left shoulder arthroplasty. EXAM: LEFT SHOULDER - 1 VIEW COMPARISON:  Chest x-ray 02/15/2015 FINDINGS: Examination demonstrates evidence of patient's left shoulder arthroplasty intact and normally located. Minimal degenerative change of the [facility]. Remainder the exam is within normal. IMPRESSION: Left shoulder arthroplasty without complicating features. Electronically Signed   By: Elberta Fortisaniel  Boyle M.D.   On: 02/23/2015 11:38    Disposition: Final discharge disposition not confirmed      Discharge Instructions    Call MD / Call 911    Complete by:  As directed   If you experience chest pain or shortness of breath, CALL 911 and be transported to the hospital emergency room.  If you develope a fever above 101 F, pus (white drainage) or increased drainage or redness at the wound, or calf pain, call your surgeon's office.     Constipation Prevention    Complete by:  As directed   Drink plenty of fluids.  Prune juice may be helpful.   You may use a stool softener, such as Colace (over the counter) 100 mg twice a day.  Use MiraLax (over the counter) for constipation as needed.     Diet - low sodium heart healthy    Complete by:  As directed      Increase activity slowly as tolerated    Complete by:  As directed            Follow-up Information    Follow up with Mable ParisHANDLER,JUSTIN WILLIAM, MD. Schedule an appointment as soon as possible for a visit in 2 weeks.   Specialty:  Orthopedic Surgery   Contact information:   [address] [place] [address] [phone]        Signed: Jiles HaroldLALIBERTE, Diquan Kassis 02/24/2015, 8:46 AM

## 2015-02-24 NOTE — Evaluation (Signed)
Occupational Therapy Evaluation Patient Details Name: Jared AlbeeRandall D Brooks MRN: [id number] DOB: 01/26/1961 Today's Date: 02/24/2015    History of Present Illness LEFT TOTAL SHOULDER ARTHROPLASTY on 02/23/2015. PMH: Pneumonia, GERD, arthritis    Clinical Impression   Pt reports he was independent with ADLs and mobility PTA. Currently pt is overall supervision for mobility and min assist for ADLs. All shoulder, ADL, safety, and HEP education complete; pt and wife with no further questions or concerns for OT at this time. Pt planning to d/c home with intermittent supervision from family. Pt ready to d/c from an OT standpoint; signing off at this time. Thank you for this referral.     Follow Up Recommendations  Supervision - Intermittent (follow up per MD)    Equipment Recommendations  None recommended by OT    Recommendations for Other Services       Precautions / Restrictions Precautions Precautions: Shoulder;Fall Type of Shoulder Precautions: Passive protocol: NO AROM of shoulder; PROM ER 30, FF 130; NO pendulums; AROM elbow, wrist, hand OK Shoulder Interventions: Shoulder sling/immobilizer;At all times;Off for dressing/bathing/exercises Precaution Booklet Issued: Yes (comment) Precaution Comments: Reviewed precautions with pt and wife Required Braces or Orthoses: Sling Restrictions Weight Bearing Restrictions: Yes LUE Weight Bearing: Non weight bearing      Mobility Bed Mobility Overal bed mobility: Needs Assistance Bed Mobility: Supine to Sit;Sit to Supine     Supine to sit: Supervision Sit to supine: Supervision   General bed mobility comments: Supervision for safety. VC for maintaining NWB status during bed mobility; pt able to maintain NWB on LUE throughout bed mobility.  Transfers Overall transfer level: Needs assistance Equipment used: None Transfers: Sit to/from Stand Sit to Stand: Supervision         General transfer comment: Supervision for safety, no  physical assist needed.     Balance Overall balance assessment: No apparent balance deficits (not formally assessed)                                          ADL Overall ADL's : Needs assistance/impaired Eating/Feeding: Set up;Sitting   Grooming: Supervision/safety;Standing   Upper Body Bathing: Minimal assitance;Sitting Upper Body Bathing Details (indicate cue type and reason): Educated on UB bathing technique; pt and wife verbalize undersanding and teach back technique. Lower Body Bathing: Minimal assistance;Sit to/from stand   Upper Body Dressing : Minimal assistance;Sitting Upper Body Dressing Details (indicate cue type and reason): Educated on UB dressing technique; pt and wife verbalize understanding and teach back technique. Lower Body Dressing: Minimal assistance;Sit to/from stand   Toilet Transfer: Supervision/safety;Ambulation;Comfort height toilet   Toileting- Clothing Manipulation and Hygiene: Supervision/safety;Sit to/from stand   Tub/ Shower Transfer: Supervision/safety;Ambulation;Grab bars Tub/Shower Transfer Details (indicate cue type and reason): Educated pt on need for close supervision during ADLs and mobility; pt verbalized understanding.  Functional mobility during ADLs: Supervision/safety General ADL Comments: Wife present for OT eval. Educated pt on sling management and wear schedule, proper positioning of UE when seated or in bed, NWB status, edema management techniques, LUE exercises; pt and wife verbalize understanding.      Vision     Perception     Praxis      Pertinent Vitals/Pain Pain Assessment: 0-10 Pain Score: 4  Pain Location: L shoulder Pain Descriptors / Indicators: Dull;Sore Pain Intervention(s): Limited activity within patient's tolerance;Monitored during session;Repositioned;Ice applied     Hand Dominance Right  Extremity/Trunk Assessment Upper Extremity Assessment Upper Extremity Assessment: LUE  deficits/detail LUE Deficits / Details: Elbow, wrist, hand ROM/MMT WFL. Unable to assess shoulder due to pain and immobilization LUE: Unable to fully assess due to pain;Unable to fully assess due to immobilization   Lower Extremity Assessment Lower Extremity Assessment: Overall WFL for tasks assessed   Cervical / Trunk Assessment Cervical / Trunk Assessment: Normal   Communication Communication Communication: No difficulties   Cognition Arousal/Alertness: Awake/alert Behavior During Therapy: WFL for tasks assessed/performed Overall Cognitive Status: Within Functional Limits for tasks assessed                     General Comments       Exercises Exercises: Shoulder     Shoulder Instructions Shoulder Instructions Donning/doffing shirt without moving shoulder: Minimal assistance;Caregiver independent with task;Patient able to independently direct caregiver (education complete ) Method for sponge bathing under operated UE: Minimal assistance;Caregiver independent with task;Patient able to independently direct caregiver (education complete ) Donning/doffing sling/immobilizer: Minimal assistance;Caregiver independent with task;Patient able to independently direct caregiver (education complete ) Correct positioning of sling/immobilizer: Supervision/safety;Caregiver independent with task;Patient able to independently direct caregiver (education complete ) ROM for elbow, wrist and digits of operated UE: Supervision/safety;Caregiver independent with task;Patient able to independently direct caregiver (education complete ) Sling wearing schedule (on at all times/off for ADL's): Modified independent;Caregiver independent with task;Patient able to independently direct caregiver (education complete ) Proper positioning of operated UE when showering: Minimal assistance;Caregiver independent with task;Patient able to independently direct caregiver (education complete ) Positioning of UE while  sleeping: Minimal assistance;Caregiver independent with task;Patient able to independently direct caregiver (education complete )    Home Living Family/patient expects to be discharged to:: Private residence Living Arrangements: Spouse/significant other Available Help at Discharge: Family;Available PRN/intermittently Type of Home: House Home Access: Stairs to enter Entrance Stairs-Number of Steps: 3   Home Layout: Two level;Able to live on main level with bedroom/bathroom     Bathroom Shower/Tub: Tub/shower unit Shower/tub characteristics: Engineer, building services: Standard     Home Equipment: None          Prior Functioning/Environment Level of Independence: Independent             OT Diagnosis: Acute pain   OT Problem List:     OT Treatment/Interventions:      OT Goals(Current goals can be found in the care plan section) Acute Rehab OT Goals Patient Stated Goal: to go home today OT Goal Formulation: With patient/family  OT Frequency:     Barriers to D/C:            Co-evaluation              End of Session Equipment Utilized During Treatment: Other (comment) (sling) Nurse Communication: Other (comment) (pt ready to d/c from OT standpoint)  Activity Tolerance: Patient tolerated treatment well Patient left: in chair;with call bell/phone within reach;with family/visitor present   Time: 0827-0906 OT Time Calculation (min): 39 min Charges:  OT General Charges $OT Visit: 1 Procedure OT Evaluation $Initial OT Evaluation Tier I: 1 Procedure OT Treatments $Self Care/Home Management : 8-22 mins $Therapeutic Exercise: 8-22 mins G-Codes:     Gaye Alken M.S., OTR/L Pager: [phone]  02/24/2015, 9:20 AM

## 2017-05-07 IMAGING — CR DG SHOULDER 1V*L*
1 series · 1 of 1 positions shown · non-contrast
Comparison: Chest x-ray 02/15/2015

CLINICAL DATA: Left shoulder arthroplasty.

EXAM:
LEFT SHOULDER - 1 VIEW

[AP]
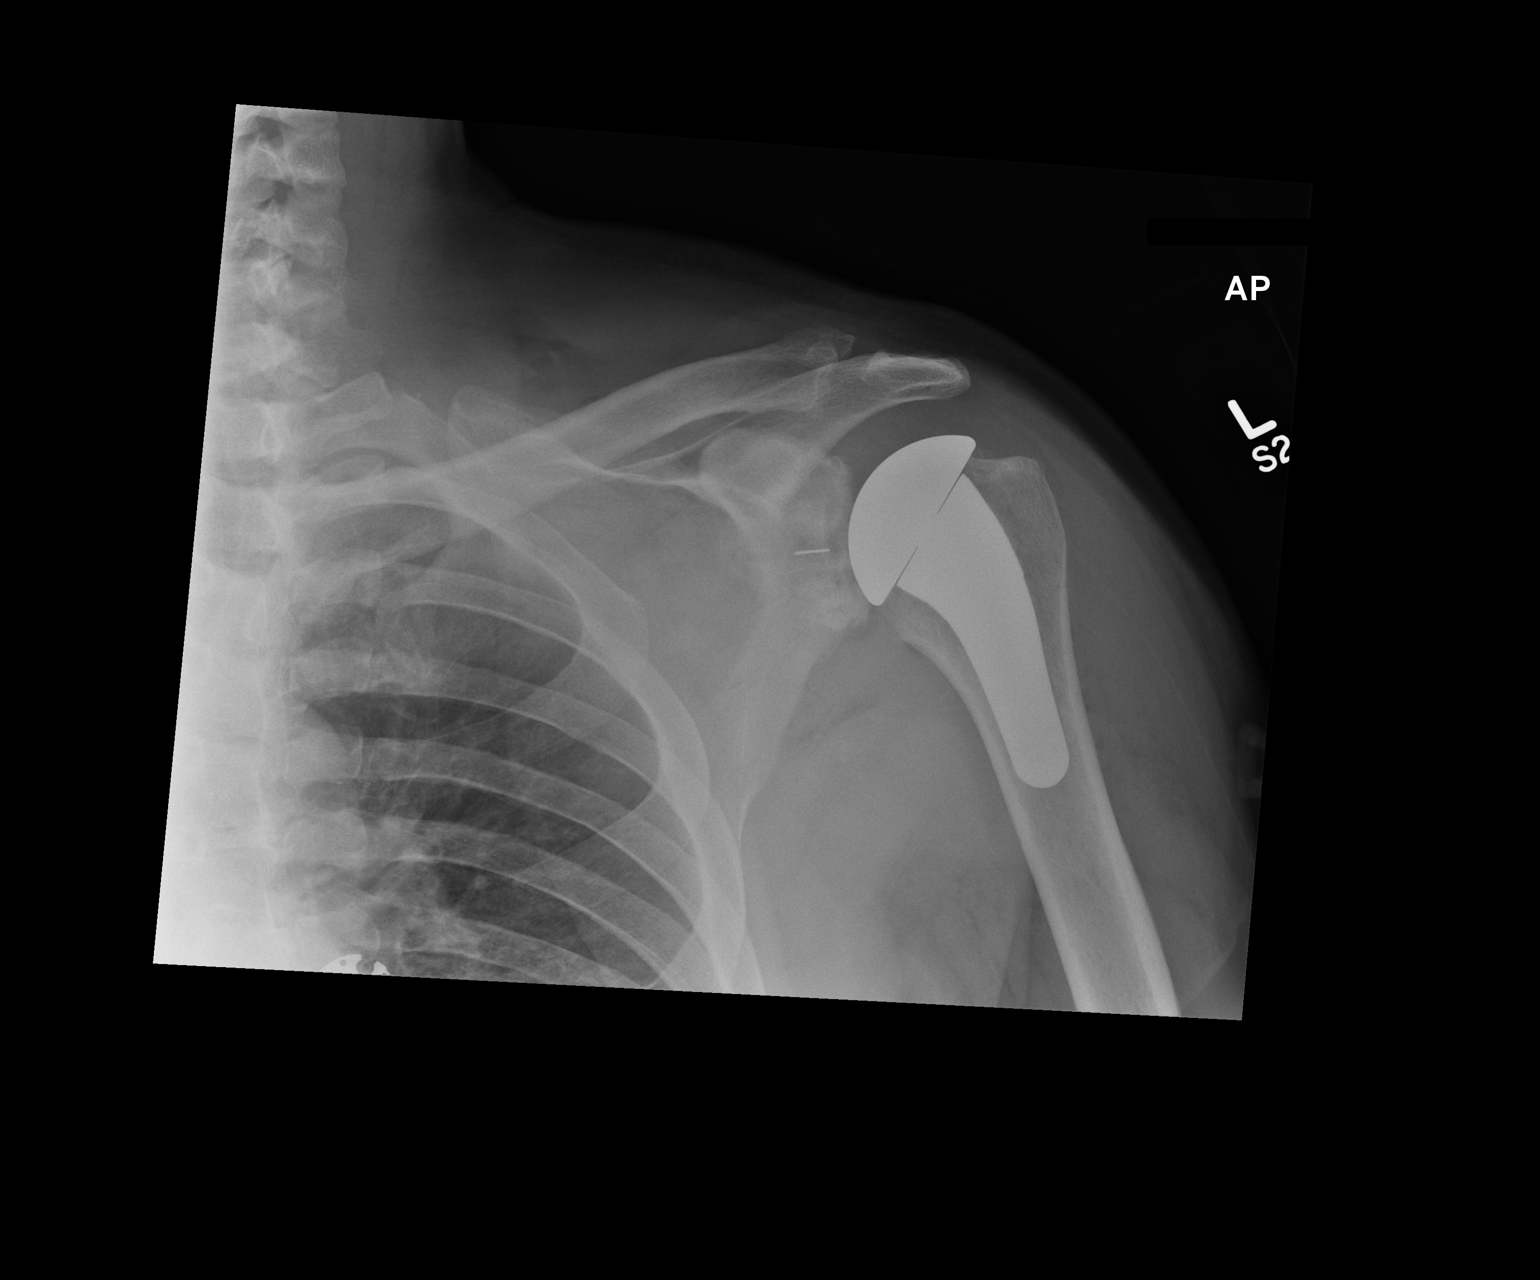

[1 of 1 positions shown; findings below may reference images not displayed]

FINDINGS: Examination demonstrates evidence of patient's left shoulder
arthroplasty intact and normally located. Minimal degenerative
change of the AC joint. Remainder the exam is within normal.
IMPRESSION: Left shoulder arthroplasty without complicating features.

## 2019-05-28 ENCOUNTER — Ambulatory Visit: Payer: Self-pay | Attending: Internal Medicine

## 2019-05-28 DIAGNOSIS — Z23 Encounter for immunization: Secondary | ICD-10-CM | POA: Insufficient documentation

## 2019-05-28 NOTE — Progress Notes (Signed)
   Covid-19 Vaccination Clinic  Name:  Javier Gell    MRN: [id number] DOB: March 26, 1961  05/28/2019  Mr. Mah was observed post Covid-19 immunization for 15 minutes without incidence. He was provided with Vaccine Information Sheet and instruction to access the V-Safe system.   Mr. Christiano was instructed to call 911 with any severe reactions post vaccine: Marland Kitchen Difficulty breathing  . Swelling of your face and throat  . A fast heartbeat  . A bad rash all over your body  . Dizziness and weakness    Immunizations Administered    Name Date Dose VIS Date Route   Pfizer COVID-19 Vaccine 05/28/2019 11:02 AM 0.3 mL 03/19/2019 Intramuscular   Manufacturer: [employer], [employer]   Lot: [id number]   NDC: [address]-0

## 2019-06-22 ENCOUNTER — Ambulatory Visit: Payer: 59 | Attending: Internal Medicine

## 2019-06-22 DIAGNOSIS — Z23 Encounter for immunization: Secondary | ICD-10-CM

## 2019-06-22 NOTE — Progress Notes (Signed)
   Covid-19 Vaccination Clinic  Name:  TODD JELINSKI    MRN: [id number] DOB: 1960/10/24  06/22/2019  Mr. Barreira was observed post Covid-19 immunization for 15 minutes without incident. He was provided with Vaccine Information Sheet and instruction to access the V-Safe system.   Mr. Kraeger was instructed to call 911 with any severe reactions post vaccine: Marland Kitchen Difficulty breathing  . Swelling of face and throat  . A fast heartbeat  . A bad rash all over body  . Dizziness and weakness   Immunizations Administered    Name Date Dose VIS Date Route   Pfizer COVID-19 Vaccine 06/22/2019  8:35 AM 0.3 mL 03/19/2019 Intramuscular   Manufacturer: [employer], [employer]   Lot: [id number]   NDC: [address]-3

## 2019-11-11 ENCOUNTER — Emergency Department
Admission: EM | Admit: 2019-11-11 | Discharge: 2019-11-11 | Disposition: A | Discharge status: Home / Self Care | Payer: 59 | Source: Home / Self Care

## 2019-11-11 ENCOUNTER — Encounter: Payer: Self-pay | Admitting: Emergency Medicine

## 2019-11-11 ENCOUNTER — Other Ambulatory Visit: Payer: Self-pay

## 2019-11-11 DIAGNOSIS — S29012A Strain of muscle and tendon of back wall of thorax, initial encounter: Secondary | ICD-10-CM | POA: Diagnosis not present

## 2019-11-11 DIAGNOSIS — M546 Pain in thoracic spine: Secondary | ICD-10-CM

## 2019-11-11 MED ORDER — METHYLPREDNISOLONE ACETATE 80 MG/ML IJ SUSP
80.0000 mg | Freq: Once | INTRAMUSCULAR | Status: AC
  Administered 2019-11-11: 80 mg via INTRAMUSCULAR

## 2019-11-11 MED ORDER — CYCLOBENZAPRINE HCL 5 MG PO TABS: 5.0000 mg | ORAL_TABLET | Freq: Two times a day (BID) | ORAL | 0 refills | Status: AC | PRN

## 2019-11-11 MED ORDER — MELOXICAM 15 MG PO TABS: 15.0000 mg | ORAL_TABLET | Freq: Every day | ORAL | 0 refills | Status: AC

## 2019-11-11 NOTE — ED Triage Notes (Signed)
Patient reports pain in left mid/lateral back for about 2 weeks; for past 4 days it has worsened and feels like it is spasm. Took aleve around 1000 tdoay. Has had covid vaccination.

## 2019-11-11 NOTE — Discharge Instructions (Signed)
°  Flexeril (cyclobenzaprine) is a muscle relaxer and may cause drowsiness. Do not drink alcohol, drive, or operate heavy machinery while taking.  Meloxicam (Mobic) is an antiinflammatory to help with pain and inflammation.  Do not take ibuprofen, Advil, Aleve, or any other medications that contain NSAIDs while taking meloxicam as this may cause stomach upset or even ulcers if taken in large amounts for an extended period of time.  TAKE WITH FOOD.  Call to schedule a follow up appointment with Sports Medicine or a previously established orthopedist for further evaluation and treatment of back pain in 1-2 weeks if not improving.
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
